# Patient Record
Sex: Male | Born: 1995 | Race: Black or African American | Hispanic: No | Marital: Single | Smoking: Former smoker
Health system: Southern US, Community
[De-identification: ages and names within clinical notes are randomized; demographics above are authoritative.]

## PROBLEM LIST (undated history)

## (undated) HISTORY — PX: OTHER SURGICAL HISTORY: SHX169

---
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
## 2017-01-14 ENCOUNTER — Emergency Department (HOSPITAL_COMMUNITY)
Admission: EM | Admit: 2017-01-14 | Discharge: 2017-01-14 | Disposition: A | Discharge status: Home / Self Care | Payer: Medicaid Other | Attending: Emergency Medicine | Admitting: Emergency Medicine

## 2017-01-14 ENCOUNTER — Encounter (HOSPITAL_COMMUNITY): Payer: Self-pay | Admitting: Emergency Medicine

## 2017-01-14 DIAGNOSIS — T408X1A Poisoning by lysergide [LSD], accidental (unintentional), initial encounter: Secondary | ICD-10-CM | POA: Diagnosis not present

## 2017-01-14 DIAGNOSIS — Z87891 Personal history of nicotine dependence: Secondary | ICD-10-CM | POA: Diagnosis not present

## 2017-01-14 DIAGNOSIS — F419 Anxiety disorder, unspecified: Secondary | ICD-10-CM | POA: Diagnosis present

## 2017-01-14 DIAGNOSIS — R441 Visual hallucinations: Secondary | ICD-10-CM | POA: Insufficient documentation

## 2017-01-14 DIAGNOSIS — R443 Hallucinations, unspecified: Secondary | ICD-10-CM

## 2017-01-14 LAB — COMPREHENSIVE METABOLIC PANEL
ALT: 45 U/L (ref 17–63)
ANION GAP: 11 (ref 5–15)
AST: 41 U/L (ref 15–41)
Albumin: 5.3 g/dL — ABNORMAL HIGH (ref 3.5–5.0)
Alkaline Phosphatase: 105 U/L (ref 38–126)
BUN: 8 mg/dL (ref 6–20)
CHLORIDE: 105 mmol/L (ref 101–111)
CO2: 22 mmol/L (ref 22–32)
Calcium: 10.4 mg/dL — ABNORMAL HIGH (ref 8.9–10.3)
Creatinine, Ser: 1.01 mg/dL (ref 0.61–1.24)
GFR calc non Af Amer: >60 mL/min (ref >60)
Glucose, Bld: 137 mg/dL — ABNORMAL HIGH (ref 65–99)
POTASSIUM: 3.5 mmol/L (ref 3.5–5.1)
SODIUM: 138 mmol/L (ref 135–145)
Total Bilirubin: 1.3 mg/dL — ABNORMAL HIGH (ref 0.3–1.2)
Total Protein: 9 g/dL — ABNORMAL HIGH (ref 6.5–8.1)

## 2017-01-14 LAB — CBC
HCT: 48 % (ref 39.0–52.0)
Hemoglobin: 17.5 g/dL — ABNORMAL HIGH (ref 13.0–17.0)
MCH: 33.1 pg (ref 26.0–34.0)
MCHC: 36.5 g/dL — ABNORMAL HIGH (ref 30.0–36.0)
MCV: 90.9 fL (ref 78.0–100.0)
PLATELETS: 178 10*3/uL (ref 150–400)
RBC: 5.28 MIL/uL (ref 4.22–5.81)
RDW: 12.5 % (ref 11.5–15.5)
WBC: 9.3 10*3/uL (ref 4.0–10.5)

## 2017-01-14 LAB — CK: Total CK: 320 U/L (ref 49–397)

## 2017-01-14 LAB — RAPID URINE DRUG SCREEN, HOSP PERFORMED
Amphetamines: NOT DETECTED
BENZODIAZEPINES: NOT DETECTED
Barbiturates: NOT DETECTED
Cocaine: NOT DETECTED
OPIATES: NOT DETECTED
Tetrahydrocannabinol: NOT DETECTED

## 2017-01-14 LAB — ETHANOL: Alcohol, Ethyl (B): <5 mg/dL (ref <5)

## 2017-01-14 MED ORDER — LORAZEPAM 1 MG PO TABS
1.0000 mg | ORAL_TABLET | Freq: Once | ORAL | Status: AC
  Administered 2017-01-14: 1 mg via ORAL
  Filled 2017-01-14: qty 1

## 2017-01-14 MED ORDER — SODIUM CHLORIDE 0.9 % IV BOLUS (SEPSIS)
1000.0000 mL | Freq: Once | INTRAVENOUS | Status: AC
  Administered 2017-01-14: 1000 mL via INTRAVENOUS

## 2017-01-14 NOTE — ED Triage Notes (Signed)
Pt advised that he will remain on monitor, continue po hydration, and will be observed over the next few hours per PA

## 2017-01-14 NOTE — ED Triage Notes (Signed)
EKG completed due to elevated heart rate

## 2017-01-14 NOTE — ED Notes (Signed)
PT IS REQUESTING ALL CONVERSATION BE PRIVATE

## 2017-01-14 NOTE — Discharge Instructions (Signed)
Please read attached information.  Please do not use any drugs or alcohol.  If any new or worsening signs or symptoms present please return immediately for further evaluation and management.

## 2017-01-14 NOTE — ED Triage Notes (Signed)
Per EMS: Pt stated that he took "acid" 14 hours ago. Pt called EMS with c/o anxiety and stomach cranp. Denies NV. Denies hallucinations. Pt is alert , oriented and appropriate at present.

## 2017-01-14 NOTE — ED Provider Notes (Signed)
[facility]-EMERGENCY DEPT Provider Note   CSN: [id number] Arrival date & time: 01/14/17  4098    History   Chief Complaint Chief Complaint  Patient presents with  . Anxiety    HPI Johnny Long is a 21 y.o. male.  HPI   21 year old male presents today after acute drug intoxication.  Patient reports that at approximately 8:30 PM last night (14 hours prior to assessment) patient placed a piece of paper with presumed LSD on his tongue.  He notes visual hallucinations throughout the night, with anxiety beginning this morning.  Patient reports he had an episode of abdominal cramping there is no longer present.  He reports no pain, no nausea vomiting.  Patient reports that he has been drinking plenty fluids at home but does not feel comfortable and is anxious.  Patient denies any history of using this drug in the past.  He reports that he did not take any other drugs or alcohol last night and does not regularly abuse drugs.  No one was with the patient while taking medication.  History reviewed. No pertinent past medical history.  There are no active problems to display for this patient.   Past Surgical History:  Procedure Laterality Date  . testical removed         Home Medications    Prior to Admission medications   Medication Sig Start Date End Date Taking? Authorizing Provider  promethazine-codeine (PHENERGAN WITH CODEINE) 6.25-10 MG/5ML syrup Take 10 mLs by mouth every 6 (six) hours as needed for cough.   Yes Historical Provider, MD    Family History History reviewed. No pertinent family history.  Social History Social History  Substance Use Topics  . Smoking status: Former Games developer  . Smokeless tobacco: Never Used  . Alcohol use Yes     Allergies   Codeine and Penicillins   Review of Systems Review of Systems  All other systems reviewed and are negative.   Physical Exam Updated Vital Signs BP 123/68   Pulse 86   Temp 98.6 F (37 C) (Oral)   Resp 16    Ht  (1.727 m)   Wt 61.7 kg   SpO2 99%   BMI 20.68 kg/m   Physical Exam  Constitutional: He is oriented to person, place, and time. He appears well-developed and well-nourished.  HENT:  Head: Normocephalic and atraumatic.  Eyes: Conjunctivae are normal. Pupils are equal, round, and reactive to light. Right eye exhibits no discharge. Left eye exhibits no discharge. No scleral icterus.  Neck: Normal range of motion. No JVD present. No tracheal deviation present.  Cardiovascular: Normal rate, regular rhythm, normal heart sounds and intact distal pulses.  Exam reveals no gallop and no friction rub.   No murmur heard. Pulmonary/Chest: Effort normal and breath sounds normal. No stridor. No respiratory distress. He has no wheezes. He has no rales. He exhibits no tenderness.  Neurological: He is alert and oriented to person, place, and time. Coordination normal.  Psychiatric: He has a normal mood and affect. His behavior is normal. Judgment and thought content normal.  Nursing note and vitals reviewed.    ED Treatments / Results  Labs (all labs ordered are listed, but only abnormal results are displayed) Labs Reviewed  COMPREHENSIVE METABOLIC PANEL - Abnormal; Notable for the following:       Result Value   Glucose, Bld 137 (*)    Calcium 10.4 (*)    Total Protein 9.0 (*)    Albumin 5.3 (*)  Total Bilirubin 1.3 (*)    All other components within normal limits  CBC - Abnormal; Notable for the following:    Hemoglobin 17.5 (*)    MCHC 36.5 (*)    All other components within normal limits  ETHANOL  RAPID URINE DRUG SCREEN, HOSP PERFORMED  CK    EKG  EKG Interpretation  Date/Time:  Sunday January 14 2017 09:54:01 EDT Ventricular Rate:  116 PR Interval:    QRS Duration: 67 QT Interval:  339 QTC Calculation: 471 R Axis:   75 Text Interpretation:  Sinus tachycardia Probable left atrial enlargement Probable left ventricular hypertrophy Borderline prolonged QT interval No  previous ECGs available Confirmed by [facility] MD, Denny Peon ([id number]) on 01/14/2017 4:24:32 PM       Radiology No results found.  Procedures Procedures (including critical care time)  Medications Ordered in ED Medications  LORazepam (ATIVAN) tablet 1 mg (1 mg Oral Given 01/14/17 1030)  LORazepam (ATIVAN) tablet 1 mg (1 mg Oral Given 01/14/17 1415)  sodium chloride 0.9 % bolus 1,000 mL (0 mLs Intravenous Stopped 01/14/17 1704)     Initial Impression / Assessment and Plan / ED Course  I have reviewed the triage vital signs and the nursing notes.  Pertinent labs & imaging results that were available during my care of the patient were reviewed by me and considered in my medical decision making (see chart for details).      Final Clinical Impressions(s) / ED Diagnoses   Final diagnoses:  Hallucinations  Anxiety  LSD overdose, accidental or unintentional, initial encounter    Labs:   CMP, ethanol, CBC rapid urine drug screen  Imaging:  Consults: Poison control  Therapeutics: Ativan  Discharge Meds:   Assessment/Plan: 21 year old male presents today with anxiety after taking LSD last night.  He is well-appearing in no acute distress.  Patient is noted to be tachycardic upon presentation.  No other drug or alcohol administration.  Due to persistent tachycardia poison control was consulted who recommended basic laboratory analysis with the addition of a CK to monitor for rhabdomyolysis.  Benzodiazepines as needed for agitation and anxiety, EKG and monitoring the patient.  They recommend 4-6 hours of monitoring the patient with repeat EKG and reassessment.  If patient is well-appearing with no laboratory or vital sign abnormalities and back to baseline patient may be safely disposed.  Patient's presentation is most consistent with anxiety secondary to drug use.  Patient will be given Ativan fluids and monitored.  5:13 PM Patient appears well in no acute distress.  He is slightly tachycardic  although improving.  Patient reports he is feeling better but still not back to his baseline.  5:13 PM Reassessment patient shows he is resting comfortably in exam bed.  Mother is at bedside requesting results from today's visit.  Patient specifically did not want his mother knowing what had happened last night.  Mother insists that is okay to speak to me.  She requests drug screen results, patient reports that it is okay to give drug screen results.  I informed her that there were no findings of drugs on our drug screen given the test that we test here.  She also reports that patient told her that he had taken cough medication, patient is sticking to the story of cough medication versus LSD.  Patient is an adult and is alert and oriented.  I do not feel it is appropriate for me to inform patient's mother of his use of LSD as he specifically does  not want me to tell her and continues to endorse an alternative story.  Patient will be given 1 more dose of Ativan here with repeat EKG.  5:13 PM patient was again formally reassessed.  He reports he feels much improved over his initial presentation but is still having some mild visual hallucinations.  Patient is in no acute distress.  When checking in on him through the window his heart rate is in the 80s and 90s, as I begin to evaluate him his heart rate goes up.  I think this is secondary to anxiety.  Patient has been tolerating p.o. here in no acute distress.  Patient continues to want me to withhold information from his mother, I feel that this is appropriate at this time.  He is instructed to continue to drink plenty of fluids, rest, and follow-up in the emergency room immediately if any new or worsening signs or symptoms present.  Patient will be discharged home with his mother who watch him closely.  Patient verbalizes understanding and agreement to today's plan had no further questions or concerns at the time of discharge   New Prescriptions New  Prescriptions   No medications on file     Eyvonne Mechanic, PA-C 01/14/17 1713    Alvira Monday, MD 01/14/17 [phone]
